# Patient Record
Sex: Male | Born: 1985 | Race: White | Hispanic: No | Marital: Married | Smoking: Current every day smoker
Health system: Southern US, Community
[De-identification: ages and names within clinical notes are randomized; demographics above are authoritative.]

---

## 2017-12-10 ENCOUNTER — Encounter (HOSPITAL_COMMUNITY): Payer: Self-pay

## 2017-12-10 ENCOUNTER — Other Ambulatory Visit: Payer: Self-pay

## 2017-12-10 ENCOUNTER — Emergency Department (HOSPITAL_COMMUNITY)
Admission: EM | Admit: 2017-12-10 | Discharge: 2017-12-11 | Disposition: A | Discharge status: Home / Self Care | Payer: Medicaid Other | Attending: Emergency Medicine | Admitting: Emergency Medicine

## 2017-12-10 ENCOUNTER — Emergency Department (HOSPITAL_COMMUNITY): Payer: Medicaid Other

## 2017-12-10 DIAGNOSIS — Y999 Unspecified external cause status: Secondary | ICD-10-CM | POA: Insufficient documentation

## 2017-12-10 DIAGNOSIS — Y9301 Activity, walking, marching and hiking: Secondary | ICD-10-CM | POA: Insufficient documentation

## 2017-12-10 DIAGNOSIS — Y929 Unspecified place or not applicable: Secondary | ICD-10-CM | POA: Insufficient documentation

## 2017-12-10 DIAGNOSIS — S0219XA Other fracture of base of skull, initial encounter for closed fracture: Secondary | ICD-10-CM | POA: Diagnosis present

## 2017-12-10 DIAGNOSIS — W1789XA Other fall from one level to another, initial encounter: Secondary | ICD-10-CM | POA: Diagnosis not present

## 2017-12-10 DIAGNOSIS — F17210 Nicotine dependence, cigarettes, uncomplicated: Secondary | ICD-10-CM | POA: Insufficient documentation

## 2017-12-10 NOTE — ED Triage Notes (Signed)
Pt was walking [place] steps at friends house, fell and struck head on unknown object. Small hemostatic laceration posterior to L ear. Pt reports severe head and neck pain, dizziness, 8/10 headache. Not currently anticoagulated. GCS 15, A&Ox4, remembers fall and event surrounding

## 2017-12-11 MED ORDER — OXYCODONE-ACETAMINOPHEN 5-325 MG PO TABS: 1.0000 | ORAL_TABLET | ORAL | 0 refills | Status: AC | PRN

## 2017-12-11 MED ORDER — OXYCODONE-ACETAMINOPHEN 5-325 MG PO TABS
1.0000 | ORAL_TABLET | Freq: Once | ORAL | Status: AC
  Administered 2017-12-11: 1 via ORAL
  Filled 2017-12-11: qty 1

## 2017-12-11 MED ORDER — IBUPROFEN 800 MG PO TABS: 800.0000 mg | ORAL_TABLET | Freq: Three times a day (TID) | ORAL | 0 refills | Status: AC

## 2017-12-11 NOTE — ED Notes (Signed)
ED Provider at bedside. 

## 2017-12-11 NOTE — Discharge Instructions (Signed)
Take the prescribed medication as directed. Follow-up with ENT in 3-4 weeks-- you need audiogram to assess your hearing.  Call the clinic in the morning to set this up. Return to the ED for new or worsening symptoms.

## 2017-12-11 NOTE — ED Provider Notes (Signed)
[facility]   CSN: [id number] Arrival date & time: 12/10/17  2023     History   Chief Complaint Chief Complaint  Patient presents with  . Head Injury    HPI Steven Duran is a 32 y.o. male.  The history is provided by the patient and medical records.    32 year old male presenting to the ED after a fall.  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  Estimates he fell approx 4-5 feet.  States immediately afterwards his left ear felt extremely "funny".  States it feels like there is a mix of water and air bubbles in his ear.  His hearing is muffled but is not completely lost.  Denies any bleeding from the ear.  States initially afterwards he felt dizzy but that is resolved at this time.  He has not had any periods of confusion, blurred vision, difficulty walking, numbness, or weakness of his arms or legs.  He is not currently on anticoagulation.  History reviewed. No pertinent past medical history.  There are no active problems to display for this patient.   History reviewed. No pertinent surgical history.      Home Medications    Prior to Admission medications   Not on File    Family History History reviewed. No pertinent family history.  Social History Social History   Tobacco Use  . Smoking status: Current Every Day Smoker  . Smokeless tobacco: Never Used  Substance Use Topics  . Alcohol use: Yes  . Drug use: Not Currently     Allergies   Penicillins and Sulfa antibiotics   Review of Systems Review of Systems  Neurological: Positive for headaches.  All other systems reviewed and are negative.    Physical Exam Updated Vital Signs BP (!) 144/95 (BP Location: Right Arm)   Pulse 82   Temp 98.9 F (37.2 C) (Oral)   Resp 20   Ht 6' (1.829 m)   Wt 64.4 kg (142 lb)   SpO2 100%   BMI 19.26  kg/m   Physical Exam  Constitutional: He is oriented to person, place, and time. He appears well-developed and well-nourished.  HENT:  Head: Normocephalic and atraumatic.  Mouth/Throat: Oropharynx is clear and moist.  Very minor 0.5cm semi-circular, superficial laceration to left posterior ears, appears to be from compression of his gauge earring in left earlobe; there is no associated hematoma; TM clear without bleeding/effusion; hearing decreased in left ear  Eyes: Pupils are equal, round, and reactive to light. Conjunctivae and EOM are normal.  Neck: Normal range of motion.  Cardiovascular: Normal rate, regular rhythm and normal heart sounds.  Pulmonary/Chest: Effort normal and breath sounds normal. No stridor. No respiratory distress.  Abdominal: Soft. Bowel sounds are normal. There is no tenderness. There is no rebound.  Musculoskeletal: Normal range of motion.  Neurological: He is alert and oriented to person, place, and time.  AAOx3, answering questions and following commands appropriately; equal strength UE and LE bilaterally; CN grossly intact; moves all extremities appropriately without ataxia; no focal neuro deficits or facial asymmetry appreciated  Skin: Skin is warm and dry.  Psychiatric: He has a normal mood and affect.  Nursing note and vitals reviewed.    ED Treatments / Results  Labs (all labs ordered are listed, but only abnormal results are displayed) Labs Reviewed -  No data to display  EKG None  Radiology Ct Head Wo Contrast  Result Date: 12/10/2017 CLINICAL DATA:  Fall walking down stairs striking head. Posttraumatic headache. EXAM: CT HEAD WITHOUT CONTRAST TECHNIQUE: Contiguous axial images were obtained from the base of the skull through the vertex without intravenous contrast. COMPARISON:  None. FINDINGS: Brain: No intracranial hemorrhage, mass effect, or midline shift. No hydrocephalus. The basilar cisterns are patent. No evidence of territorial infarct or  acute ischemia. No extra-axial or intracranial fluid collection. Vascular: No hyperdense vessel or unexpected calcification. Skull: Essentially nondisplaced left temporal bone fracture involving the inferior aspect, not entirely included in the field of view. Associated opacification of lower left mastoid air cells. There is no extension to the middle or inner ear cavity. Trace adjacent subcutaneous emphysema. No other skull fracture. Sinuses/Orbits: Rightward nasal septal deviation and question of remote nasal bone fracture. No evidence of acute facial bone fracture. Minimal mucosal thickening of anterior ethmoid air cells without sinus fluid level. Other: None. IMPRESSION: 1. Essentially nondisplaced inferior left temporal bone fracture with associated opacification of lower mastoid air cells and trace subcutaneous emphysema. Fracture does not extend to the middle or inner ear cavity. 2. No intracranial hemorrhage or other skull fracture. Electronically Signed   By: Rubye Oaks M.D.   On: 12/10/2017 23:02    Procedures Procedures (including critical care time)  Medications Ordered in ED Medications - No data to display   Initial Impression / Assessment and Plan / ED Course  I have reviewed the triage vital signs and the nursing notes.  Pertinent labs & imaging results that were available during my care of the patient were reviewed by me and considered in my medical decision making (see chart for details).  32 year old male presenting to the ED with head injury.  Mechanical fall and fell off a porch approximately 4-5 feet.  Landed on the left side of his head.  Sustained small laceration that appears to be from his gauge earring in the left earlobe.  This is not require surgical repair.  There is no associated hematoma.  Does have some tenderness of the left temporal bone and mastoid area.  TM is clear, no hemotympanum or effusion.  He is awake, alert, fully oriented.  No focal neurologic  deficits.  CT was obtained from triage reveals temporal bone fracture with opacification mastoid air cells.  Will discuss with ENT.  12:24 AM Discussed with Dr. Doran Heater with ENT-- will need office follow-up in 3-4 weeks with audiogram.  Plan discussed with patient, he is comfortable with this.  Will discharge home with small supply of pain medication.  Return here for worsening symptoms including worsening headache, bleeding from the ear, dizziness, vision, blurred vision, etc.  Patient discharged home in stable condition.  Final Clinical Impressions(s) / ED Diagnoses   Final diagnoses:  Closed fracture of temporal bone, initial encounter [facility])    ED Discharge Orders        Ordered    oxyCODONE-acetaminophen (PERCOCET) 5-325 MG tablet  Every 4 hours PRN     12/11/17 0038    ibuprofen (ADVIL,MOTRIN) 800 MG tablet  3 times daily     12/11/17 0038       Garlon Hatchet, PA-C 12/11/17 1610    Shon Baton, MD 12/11/17 [phone]

## 2019-06-30 IMAGING — CT CT HEAD W/O CM
3 series · 15 of 47 positions shown, 18 images · non-contrast
Comparison: None.

CLINICAL DATA: Fall walking down stairs striking head.
Posttraumatic headache.

EXAM:
CT HEAD WITHOUT CONTRAST
TECHNIQUE: Contiguous axial images were obtained from the base of the skull
through the vertex without intravenous contrast.

[Series 3: head 5.0 h30s · axial · 0.40mm/px · z∈[-87,+43]mm · 9 of 32 slices shown, 12 images]
[im 3/32  brain]
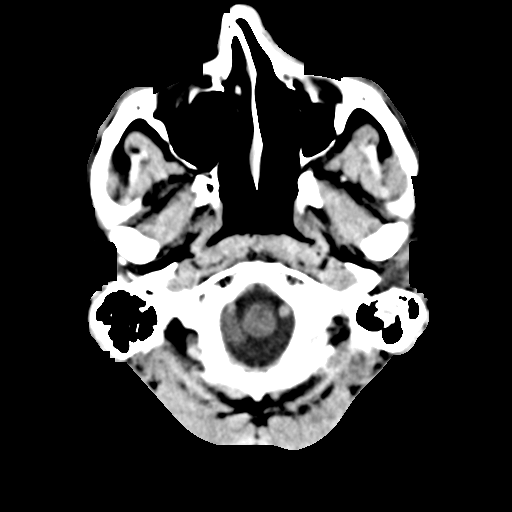
[im 3/32  bone]
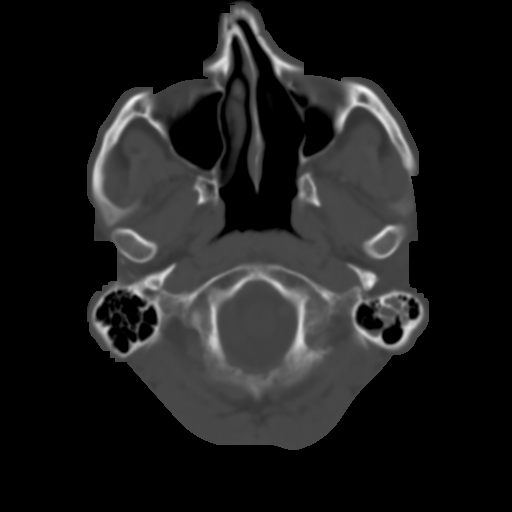
[im 6/32  brain]
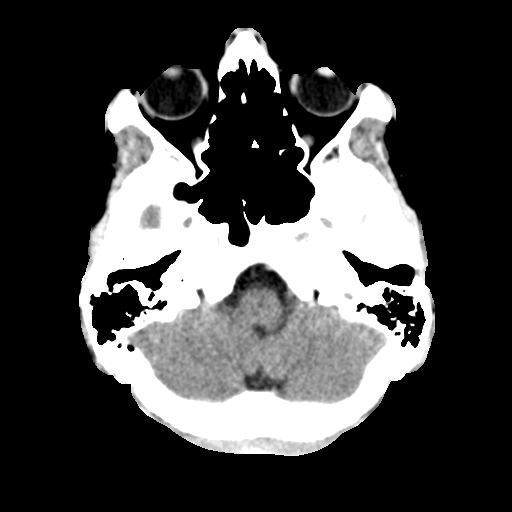
[im 9/32  brain]
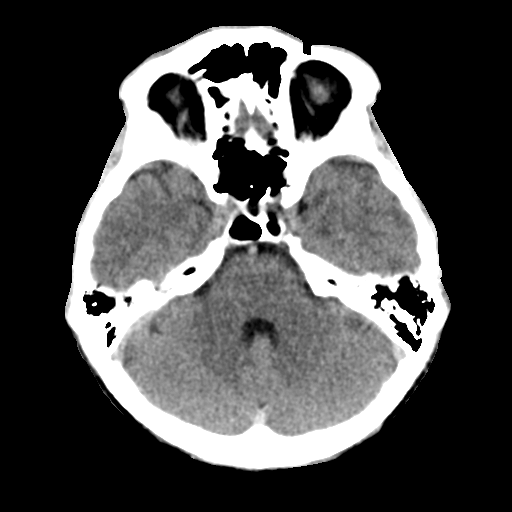
[im 12/32  brain]
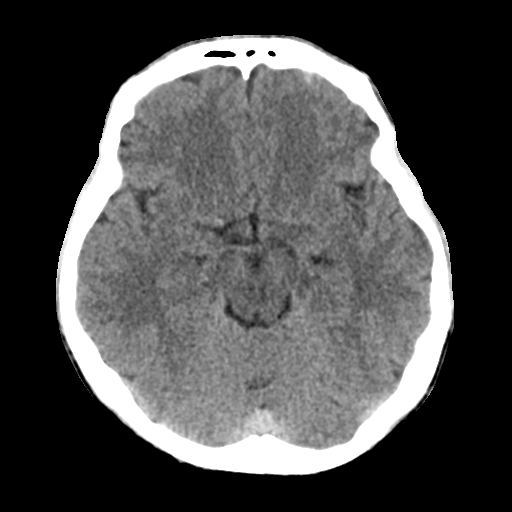
[im 17/32  brain]
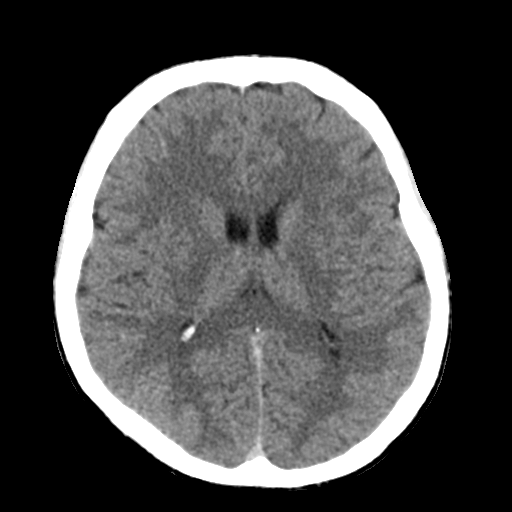
[im 17/32  bone]
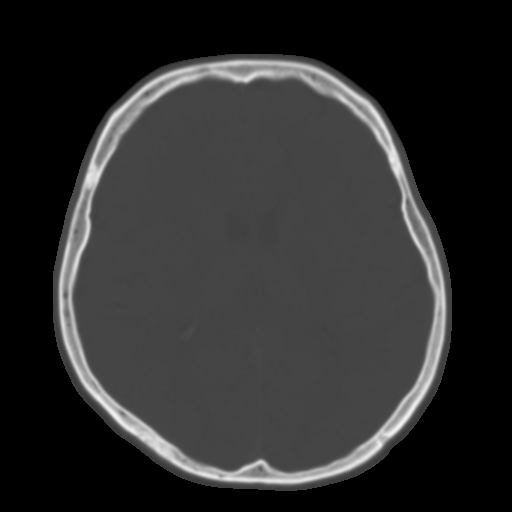
[im 20/32  brain]
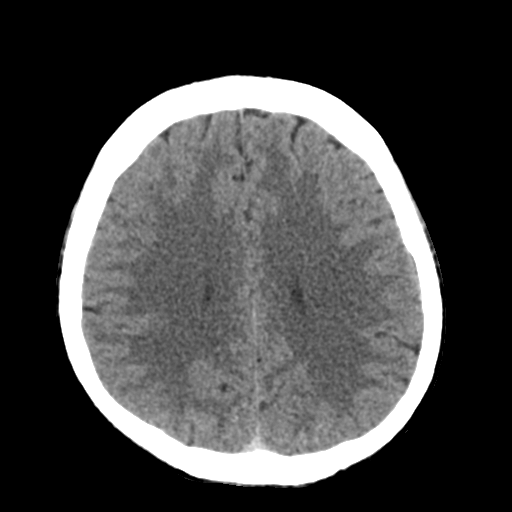
[im 23/32  brain]
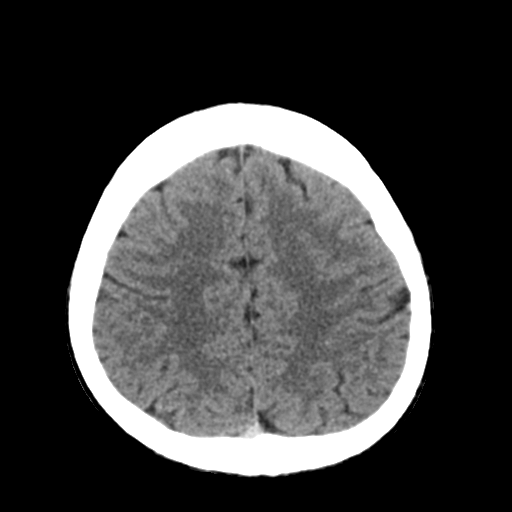
[im 26/32  brain]
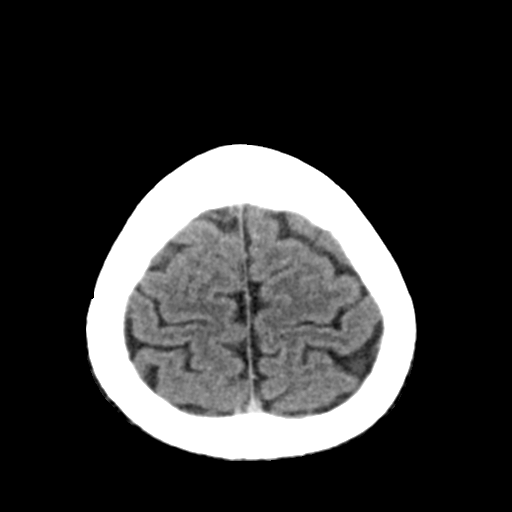
[im 29/32  brain]
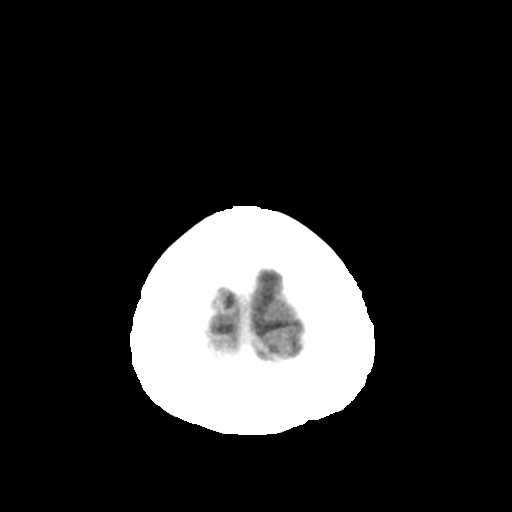
[im 29/32  bone]
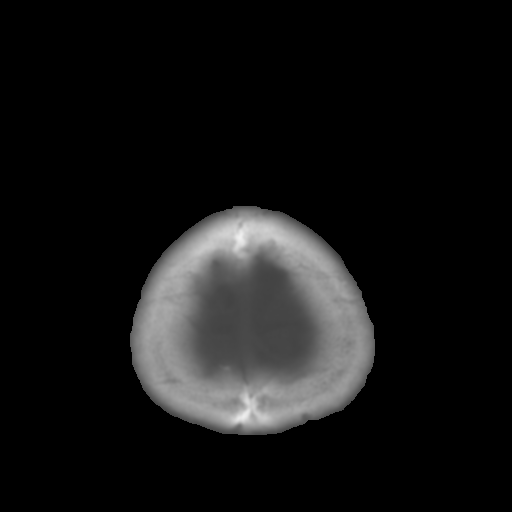

[Series 5: head 3.0 mpr cor · coronal · 0.31mm/px · 3 of 67 slices shown]
[im 23/67  brain]
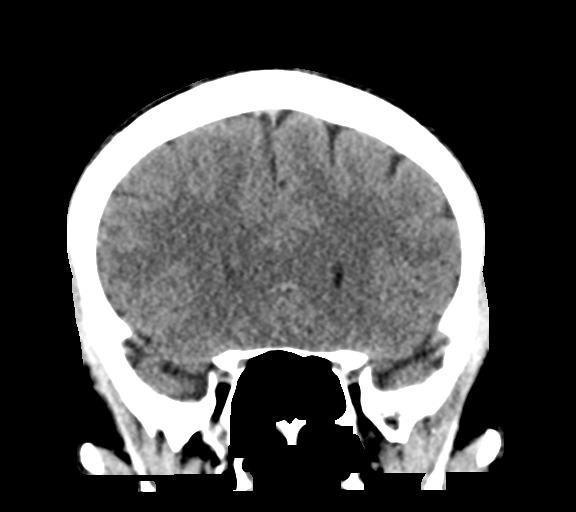
[im 30/67  brain]
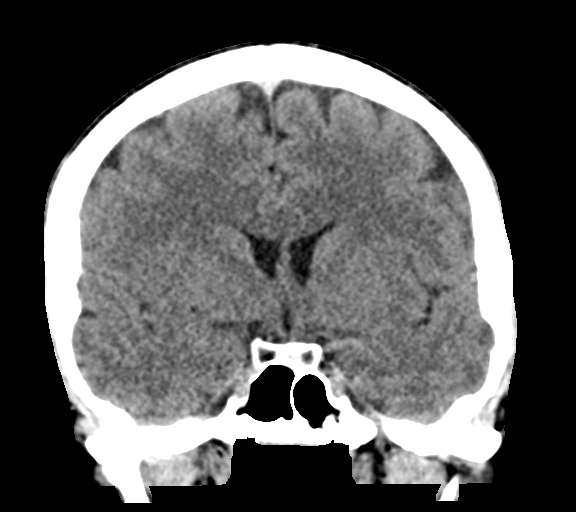
[im 37/67  brain]
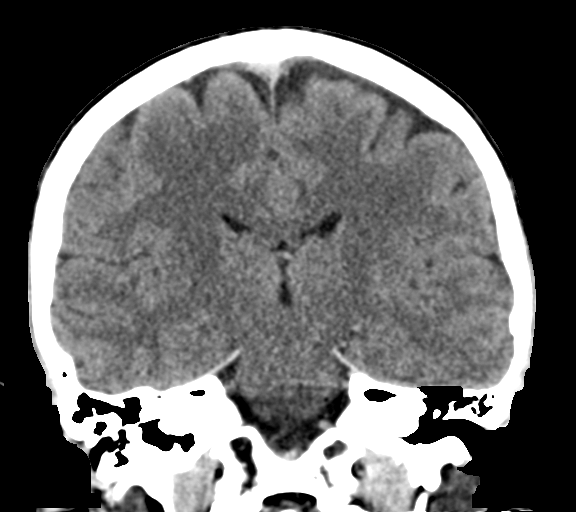

[Series 6: head 3.0 mpr sag · sagittal · 0.31mm/px · 3 of 57 slices shown]
[im 19/57  brain]
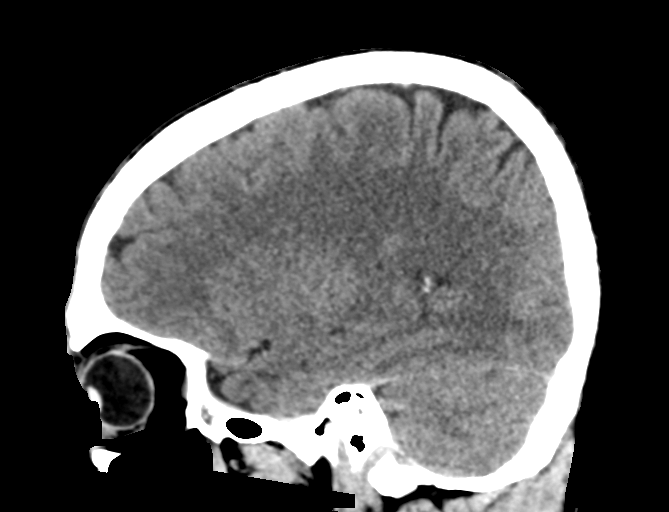
[im 29/57  brain]
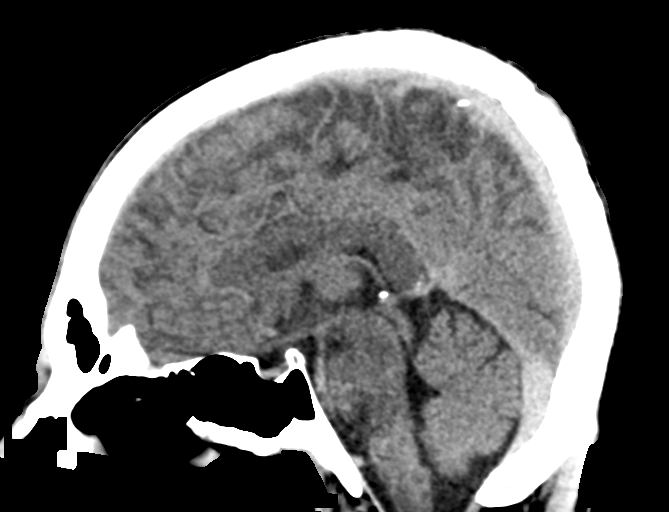
[im 38/57  brain]
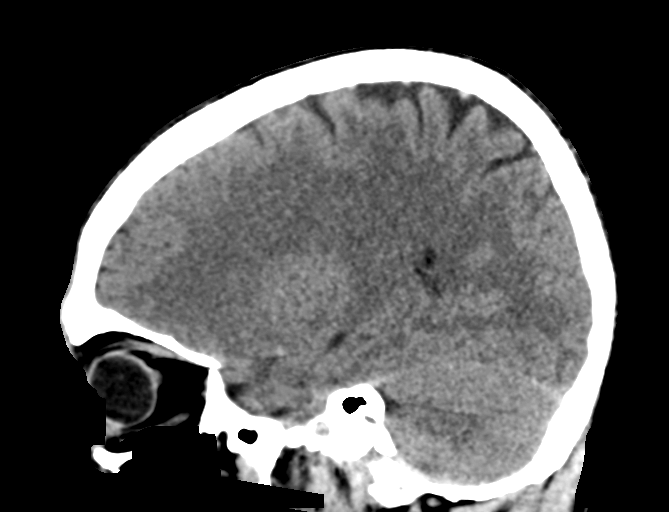

[15 of 47 positions shown; findings below may reference images not displayed]

FINDINGS: Brain: No intracranial hemorrhage, mass effect, or midline shift. No
hydrocephalus. The basilar cisterns are patent. No evidence of
territorial infarct or acute ischemia. No extra-axial or
intracranial fluid collection.

Vascular: No hyperdense vessel or unexpected calcification.

Skull: Essentially nondisplaced left temporal bone fracture
involving the inferior aspect, not entirely included in the field of
view. Associated opacification of lower left mastoid air cells.
There is no extension to the middle or inner ear cavity. Trace
adjacent subcutaneous emphysema. No other skull fracture.

Sinuses/Orbits: Rightward nasal septal deviation and question of
remote nasal bone fracture. No evidence of acute facial bone
fracture. Minimal mucosal thickening of anterior ethmoid air cells
without sinus fluid level.

Other: None.
IMPRESSION: 1. Essentially nondisplaced inferior left temporal bone fracture
with associated opacification of lower mastoid air cells and trace
subcutaneous emphysema. Fracture does not extend to the middle or
inner ear cavity.
2. No intracranial hemorrhage or other skull fracture.
